# Patient Record
Sex: Female | Born: 1955 | Race: Black or African American | Hispanic: No | Marital: Single | Smoking: Never smoker
Health system: Southern US, Community
[De-identification: ages and names within clinical notes are randomized; demographics above are authoritative.]

## PROBLEM LIST (undated history)

## (undated) DIAGNOSIS — I341 Nonrheumatic mitral (valve) prolapse: Secondary | ICD-10-CM

## (undated) DIAGNOSIS — I499 Cardiac arrhythmia, unspecified: Secondary | ICD-10-CM

## (undated) DIAGNOSIS — I498 Other specified cardiac arrhythmias: Secondary | ICD-10-CM

## (undated) HISTORY — PX: OVARIAN CYST REMOVAL: SHX89

## (undated) HISTORY — PX: OTHER SURGICAL HISTORY: SHX169

---

## 2004-04-08 ENCOUNTER — Encounter: Admission: RE | Admit: 2004-04-08 | Discharge: 2004-04-08 | Payer: Self-pay | Admitting: Family Medicine

## 2006-02-21 ENCOUNTER — Encounter: Admission: RE | Admit: 2006-02-21 | Discharge: 2006-02-21 | Payer: Self-pay | Admitting: Internal Medicine

## 2010-02-13 ENCOUNTER — Encounter: Payer: Self-pay | Admitting: Family Medicine

## 2010-02-13 ENCOUNTER — Encounter: Payer: Self-pay | Admitting: Internal Medicine

## 2013-01-08 ENCOUNTER — Encounter (HOSPITAL_COMMUNITY): Payer: Self-pay | Admitting: Emergency Medicine

## 2013-01-08 ENCOUNTER — Emergency Department (HOSPITAL_COMMUNITY)
Admission: EM | Admit: 2013-01-08 | Discharge: 2013-01-08 | Disposition: A | Discharge status: Home / Self Care | Payer: BC Managed Care – PPO | Attending: Emergency Medicine | Admitting: Emergency Medicine

## 2013-01-08 ENCOUNTER — Emergency Department (HOSPITAL_COMMUNITY): Payer: BC Managed Care – PPO

## 2013-01-08 DIAGNOSIS — R0789 Other chest pain: Secondary | ICD-10-CM | POA: Insufficient documentation

## 2013-01-08 DIAGNOSIS — M79609 Pain in unspecified limb: Secondary | ICD-10-CM | POA: Insufficient documentation

## 2013-01-08 DIAGNOSIS — Z7982 Long term (current) use of aspirin: Secondary | ICD-10-CM | POA: Insufficient documentation

## 2013-01-08 DIAGNOSIS — M79602 Pain in left arm: Secondary | ICD-10-CM

## 2013-01-08 DIAGNOSIS — I498 Other specified cardiac arrhythmias: Secondary | ICD-10-CM | POA: Insufficient documentation

## 2013-01-08 DIAGNOSIS — Z79899 Other long term (current) drug therapy: Secondary | ICD-10-CM | POA: Insufficient documentation

## 2013-01-08 DIAGNOSIS — M25519 Pain in unspecified shoulder: Secondary | ICD-10-CM | POA: Insufficient documentation

## 2013-01-08 DIAGNOSIS — R0602 Shortness of breath: Secondary | ICD-10-CM | POA: Insufficient documentation

## 2013-01-08 DIAGNOSIS — R42 Dizziness and giddiness: Secondary | ICD-10-CM | POA: Insufficient documentation

## 2013-01-08 DIAGNOSIS — Z8679 Personal history of other diseases of the circulatory system: Secondary | ICD-10-CM | POA: Insufficient documentation

## 2013-01-08 DIAGNOSIS — R079 Chest pain, unspecified: Secondary | ICD-10-CM

## 2013-01-08 HISTORY — DX: Cardiac arrhythmia, unspecified: I49.9

## 2013-01-08 HISTORY — DX: Other specified cardiac arrhythmias: I49.8

## 2013-01-08 HISTORY — DX: Nonrheumatic mitral (valve) prolapse: I34.1

## 2013-01-08 LAB — BASIC METABOLIC PANEL
BUN: 17 mg/dL (ref 6–23)
CO2: 28 mEq/L (ref 19–32)
Calcium: 9.3 mg/dL (ref 8.4–10.5)
Chloride: 104 mEq/L (ref 96–112)
Creatinine, Ser: 0.78 mg/dL (ref 0.50–1.10)
GFR calc Af Amer: >90 mL/min (ref >90)
GFR calc non Af Amer: 90 mL/min — ABNORMAL LOW (ref >90)
Sodium: 141 mEq/L (ref 135–145)

## 2013-01-08 LAB — CBC
Hemoglobin: 13.4 g/dL (ref 12.0–15.0)
MCHC: 34.7 g/dL (ref 30.0–36.0)

## 2013-01-08 LAB — TROPONIN I: Troponin I: <0.3 ng/mL (ref <0.30)

## 2013-01-08 MED ORDER — ASPIRIN 81 MG PO CHEW: 324.0000 mg | CHEWABLE_TABLET | Freq: Once | ORAL | Status: DC

## 2013-01-08 NOTE — ED Notes (Signed)
Pt given d/c instructions and verbalized understanding. NAD at this time. VS are stable.  

## 2013-01-08 NOTE — ED Notes (Signed)
EDP Evelyn Serrano at the bedside updating the pt.

## 2013-01-08 NOTE — ED Provider Notes (Signed)
CSN: [id number]     Arrival date & time 01/08/13  1553 History   First MD Initiated Contact with Patient 01/08/13 1702     Chief Complaint  Patient presents with  . Shoulder Pain  . Arm Pain   (Consider location/radiation/quality/duration/timing/severity/associated sxs/prior Treatment) Patient is a 57 y.o. female presenting with chest pain. The history is provided by the patient.  Chest Pain Pain location:  L chest (left upper chest) Pain quality: sharp   Radiates to: left shoulder and arm to elbow. Pain severity:  Moderate Onset quality:  Gradual Duration:  3 hours Timing:  Constant Progression:  Partially resolved Chronicity:  New Context comment:  Last night, while exercising,she noticed that her left hand felt "funny."  Then, she felt unable to lay flat, and had to prop herself up to sleep.  (not due to shortness of breath, but because she felt uncomfortable flat_).  Then chest pain developed. Relieved by:  Nothing Worsened by:  Nothing tried Ineffective treatments:  None tried Associated symptoms: dizziness and shortness of breath   Associated symptoms: no abdominal pain, no cough, no diaphoresis, no fever, no lower extremity edema, no nausea and not vomiting     Past Medical History  Diagnosis Date  . Mitral valve prolapse   . Bigeminy    History reviewed. No pertinent past surgical history. History reviewed. No pertinent family history. History  Substance Use Topics  . Smoking status: Never Smoker   . Smokeless tobacco: Not on file  . Alcohol Use: No   OB History   Grav Para Term Preterm Abortions TAB SAB Ect Mult Living                 Review of Systems  Constitutional: Negative for fever and diaphoresis.  HENT: Negative for congestion.   Respiratory: Positive for shortness of breath. Negative for cough.   Cardiovascular: Positive for chest pain.  Gastrointestinal: Negative for nausea, vomiting, abdominal pain and diarrhea.  Neurological: Positive for  dizziness.  All other systems reviewed and are negative.    Allergies  Ace inhibitors  Home Medications   Current Outpatient Rx  Name  Route  Sig  Dispense  Refill  . Ascorbic Acid (VITAMIN C) 1000 MG tablet   Oral   Take 1,000 mg by mouth daily.         Marland Kitchen aspirin 81 MG tablet   Oral   Take 162 mg by mouth daily.         . B Complex-Biotin-FA (B COMPLETE) TABS   Oral   Take 1 tablet by mouth daily.         . Calcium Carb-Cholecalciferol (CALCIUM 600 + D) 600-200 MG-UNIT TABS   Oral   Take 1 tablet by mouth daily.         . cholecalciferol (VITAMIN D) 400 UNITS TABS tablet   Oral   Take 400 Units by mouth daily.         . Coenzyme Q10 (COQ-10) 50 MG CAPS   Oral   Take 1 capsule by mouth daily.         . Flaxseed, Linseed, (FLAXSEED OIL) 1000 MG CAPS   Oral   Take 1 capsule by mouth daily.         Marland Kitchen GARLIC PO   Oral   Take 1 capsule by mouth daily.          . Ginkgo Biloba 500 MG CAPS   Oral   Take 1 capsule by mouth  daily.         . Glucosamine HCl 1000 MG TABS   Oral   Take 1 tablet by mouth daily.         . hydrochlorothiazide (MICROZIDE) 12.5 MG capsule   Oral   Take 12.5 mg by mouth daily.         . metoprolol succinate (TOPROL-XL) 50 MG 24 hr tablet   Oral   Take 50 mg by mouth daily. Take with or immediately following a meal.         . Multiple Vitamin (MULTIVITAMIN WITH MINERALS) TABS tablet   Oral   Take 1 tablet by mouth daily.         . Omega-3 Fatty Acids (FISH OIL) 1000 MG CAPS   Oral   Take 2 capsules by mouth daily.         . SOY ISOFLAVONE PO   Oral   Take 1 capsule by mouth daily.         Marland Kitchen telmisartan (MICARDIS) 80 MG tablet   Oral   Take 80 mg by mouth daily.          BP 155/76  Pulse 63  Temp(Src) 98.1 F (36.7 C) (Oral)  Resp 18  Ht 5\' 8"  (1.727 m)  Wt 202 lb (91.627 kg)  BMI 30.72 kg/m2  SpO2 100% Physical Exam  Nursing note and vitals reviewed. Constitutional: She is oriented  to person, place, and time. She appears well-developed and well-nourished. No distress.  HENT:  Head: Normocephalic and atraumatic.  Mouth/Throat: Oropharynx is clear and moist.  Eyes: Conjunctivae are normal. Pupils are equal, round, and reactive to light. No scleral icterus.  Neck: Neck supple.  Cardiovascular: Normal rate, regular rhythm, normal heart sounds and intact distal pulses.   No murmur heard. Pulmonary/Chest: Effort normal and breath sounds normal. No stridor. No respiratory distress. She has no rales.  Abdominal: Soft. Bowel sounds are normal. She exhibits no distension. There is no tenderness.  Musculoskeletal: Normal range of motion.       Left shoulder: She exhibits tenderness and bony tenderness. She exhibits normal range of motion (pain with ROM).  Neurological: She is alert and oriented to person, place, and time.  Skin: Skin is warm and dry. No rash noted.  Psychiatric: She has a normal mood and affect. Her behavior is normal.    ED Course  Procedures (including critical care time) Labs Review Labs Reviewed  CBC  BASIC METABOLIC PANEL   Imaging Review Dg Chest 2 View  01/08/2013   CLINICAL DATA:  Chest pain  EXAM: CHEST  2 VIEW  COMPARISON:  None.  FINDINGS: The heart size and mediastinal contours are within normal limits. No acute infiltrate or pulmonary edema. Mild degenerative changes thoracic spine.  IMPRESSION: No active cardiopulmonary disease.   Electronically Signed   By: Natasha Mead M.D.   On: 01/08/2013 17:07  All radiology studies independently viewed by me.     EKG Interpretation    Date/Time:  Wednesday January 08 2013 15:56:48 EST Ventricular Rate:  86 PR Interval:  154 QRS Duration: 82 QT Interval:  364 QTC Calculation: 435 R Axis:   42 Text Interpretation:  Normal sinus rhythm Normal ECG No old tracing to compare Confirmed by [facility]  MD, TREY ([id number]) on 01/08/2013 5:25:34 PM            MDM   1. Chest pain   2. Left arm pain     57 yo female with  atypical chest pain.  She has a history of left frozen shoulder, and is unsure if her pain is related to her shoulder.  Pain in her chest has near resolved and she now has pain primarily in her left upper arm.  She thinks she may have had some shortness of breath, but no nausea.  She has felt lightheaded.  Hx of HTN and DM, but not HLD, no family hx of CAD.  Her description of her inability to lay flat is unclear, but she reports not feeling SOB.  She is able to lay flat in the ED.   Plan lab workup, but feel that she is low risk for ACS.  Also doubt PE and Dissection.    8:54 PM Initial workup reassuring.  Plan delta troponin and then DC home with  PCP if negative.  Sharen Hones will follow up on delta troponin.    Candyce Churn, MD 01/08/13 [phone]

## 2013-01-08 NOTE — ED Notes (Signed)
Pt c/o left shoulder and arm pain starting last night after exercising; pt sts some SOB and still having pain in shoulder and left arm

## 2013-01-08 NOTE — ED Provider Notes (Signed)
Second cardiac marker term negative patient has remained pain-free.  She will follow up with her primary care physician by phone in the morning  Arman Filter, NP 01/08/13 2213

## 2013-01-09 NOTE — ED Provider Notes (Signed)
Medical screening examination/treatment/procedure(s) were conducted as a shared visit with non-physician practitioner(s) and myself.  I personally evaluated the patient during the encounter.   Please see my separate note.     Candyce Churn, MD 01/09/13 2153

## 2014-03-31 ENCOUNTER — Emergency Department (HOSPITAL_COMMUNITY)
Admission: EM | Admit: 2014-03-31 | Discharge: 2014-03-31 | Disposition: A | Discharge status: Home / Self Care | Payer: BLUE CROSS/BLUE SHIELD | Attending: Emergency Medicine | Admitting: Emergency Medicine

## 2014-03-31 ENCOUNTER — Encounter (HOSPITAL_COMMUNITY): Payer: Self-pay

## 2014-03-31 DIAGNOSIS — R002 Palpitations: Secondary | ICD-10-CM

## 2014-03-31 DIAGNOSIS — Z7982 Long term (current) use of aspirin: Secondary | ICD-10-CM | POA: Insufficient documentation

## 2014-03-31 DIAGNOSIS — M79601 Pain in right arm: Secondary | ICD-10-CM | POA: Diagnosis not present

## 2014-03-31 DIAGNOSIS — Z79899 Other long term (current) drug therapy: Secondary | ICD-10-CM | POA: Insufficient documentation

## 2014-03-31 DIAGNOSIS — R42 Dizziness and giddiness: Secondary | ICD-10-CM | POA: Diagnosis not present

## 2014-03-31 DIAGNOSIS — Z8679 Personal history of other diseases of the circulatory system: Secondary | ICD-10-CM | POA: Diagnosis not present

## 2014-03-31 LAB — CBC
HCT: 38.9 % (ref 36.0–46.0)
Hemoglobin: 13.7 g/dL (ref 12.0–15.0)
MCH: 29.4 pg (ref 26.0–34.0)
MCHC: 35.2 g/dL (ref 30.0–36.0)
MCV: 83.5 fL (ref 78.0–100.0)
Platelets: 212 10*3/uL (ref 150–400)
RBC: 4.66 MIL/uL (ref 3.87–5.11)
RDW: 15.1 % (ref 11.5–15.5)
WBC: 7.5 10*3/uL (ref 4.0–10.5)

## 2014-03-31 LAB — COMPREHENSIVE METABOLIC PANEL
ALT: 24 U/L (ref 0–35)
AST: 18 U/L (ref 0–37)
Albumin: 4.1 g/dL (ref 3.5–5.2)
Alkaline Phosphatase: 56 U/L (ref 39–117)
Anion gap: 15 (ref 5–15)
BUN: 17 mg/dL (ref 6–23)
CALCIUM: 9.6 mg/dL (ref 8.4–10.5)
CO2: 21 mmol/L (ref 19–32)
CREATININE: 1.09 mg/dL (ref 0.50–1.10)
Chloride: 105 mmol/L (ref 96–112)
GFR calc non Af Amer: 54 mL/min — ABNORMAL LOW (ref >90)
GFR, EST AFRICAN AMERICAN: 63 mL/min — AB (ref >90)
Glucose, Bld: 147 mg/dL — ABNORMAL HIGH (ref 70–99)
Potassium: 3.7 mmol/L (ref 3.5–5.1)
Sodium: 141 mmol/L (ref 135–145)
Total Bilirubin: 0.3 mg/dL (ref 0.3–1.2)
Total Protein: 7.2 g/dL (ref 6.0–8.3)

## 2014-03-31 LAB — TROPONIN I

## 2014-03-31 LAB — I-STAT CHEM 8, ED
BUN: 19 mg/dL (ref 6–23)
CALCIUM ION: 1.19 mmol/L (ref 1.13–1.30)
CREATININE: 1 mg/dL (ref 0.50–1.10)
Chloride: 101 mmol/L (ref 96–112)
Glucose, Bld: 148 mg/dL — ABNORMAL HIGH (ref 70–99)
HEMATOCRIT: 43 % (ref 36.0–46.0)
HEMOGLOBIN: 14.6 g/dL (ref 12.0–15.0)
Potassium: 3.6 mmol/L (ref 3.5–5.1)
Sodium: 141 mmol/L (ref 135–145)
TCO2: 25 mmol/L (ref 0–100)

## 2014-03-31 LAB — I-STAT TROPONIN, ED: Troponin i, poc: 0 ng/mL (ref 0.00–0.08)

## 2014-03-31 LAB — DIFFERENTIAL
Basophils Absolute: 0 10*3/uL (ref 0.0–0.1)
Basophils Relative: 0 % (ref 0–1)
EOS ABS: 0.1 10*3/uL (ref 0.0–0.7)
Eosinophils Relative: 2 % (ref 0–5)
Lymphocytes Relative: 30 % (ref 12–46)
Lymphs Abs: 2.3 10*3/uL (ref 0.7–4.0)
Monocytes Absolute: 0.4 10*3/uL (ref 0.1–1.0)
Monocytes Relative: 6 % (ref 3–12)
NEUTROS PCT: 62 % (ref 43–77)
Neutro Abs: 4.6 10*3/uL (ref 1.7–7.7)

## 2014-03-31 LAB — CBG MONITORING, ED
Glucose-Capillary: 141 mg/dL — ABNORMAL HIGH (ref 70–99)
Glucose-Capillary: 150 mg/dL — ABNORMAL HIGH (ref 70–99)

## 2014-03-31 LAB — APTT: aPTT: 28 seconds (ref 24–37)

## 2014-03-31 LAB — PROTIME-INR
INR: 1.01 (ref 0.00–1.49)
PROTHROMBIN TIME: 13.4 s (ref 11.6–15.2)

## 2014-03-31 NOTE — ED Notes (Signed)
Pt states she was sitting at her desk and started having right arm pain, then started having lightheadedness and started having palpitations took 2- 325 mg ASA. Called her cardiologist and she recommended coming in to be seen. Denies any weakness or numbness or tingling. No facial droop. No speech deficits.

## 2014-03-31 NOTE — Discharge Instructions (Signed)
Palpitations A palpitation is the feeling that your heartbeat is irregular or is faster than normal. It may feel like your heart is fluttering or skipping a beat. Palpitations are usually not a serious problem. However, in some cases, you may need further medical evaluation. CAUSES  Palpitations can be caused by:  Smoking.  Caffeine or other stimulants, such as diet pills or energy drinks.  Alcohol.  Stress and anxiety.  Strenuous physical activity.  Fatigue.  Certain medicines.  Heart disease, especially if you have a history of irregular heart rhythms (arrhythmias), such as atrial fibrillation, atrial flutter, or supraventricular tachycardia.  An improperly working pacemaker or defibrillator. DIAGNOSIS  To find the cause of your palpitations, your health care provider will take your medical history and perform a physical exam. Your health care provider may also have you take a test called an ambulatory electrocardiogram (ECG). An ECG records your heartbeat patterns over a 24-hour period. You may also have other tests, such as:  Transthoracic echocardiogram (TTE). During echocardiography, sound waves are used to evaluate how blood flows through your heart.  Transesophageal echocardiogram (TEE).  Cardiac monitoring. This allows your health care provider to monitor your heart rate and rhythm in real time.  Holter monitor. This is a portable device that records your heartbeat and can help diagnose heart arrhythmias. It allows your health care provider to track your heart activity for several days, if needed.  Stress tests by exercise or by giving medicine that makes the heart beat faster. TREATMENT  Treatment of palpitations depends on the cause of your symptoms and can vary greatly. Most cases of palpitations do not require any treatment other than time, relaxation, and monitoring your symptoms. Other causes, such as atrial fibrillation, atrial flutter, or supraventricular  tachycardia, usually require further treatment. HOME CARE INSTRUCTIONS   Avoid:  Caffeinated coffee, tea, soft drinks, diet pills, and energy drinks.  Chocolate.  Alcohol.  Stop smoking if you smoke.  Reduce your stress and anxiety. Things that can help you relax include:  A method of controlling things in your body, such as your heartbeats, with your mind (biofeedback).  Yoga.  Meditation.  Physical activity such as swimming, jogging, or walking.  Get plenty of rest and sleep. SEEK MEDICAL CARE IF:   You continue to have a fast or irregular heartbeat beyond 24 hours.  Your palpitations occur more often. SEEK IMMEDIATE MEDICAL CARE IF:  You have chest pain or shortness of breath.  You have a severe headache.  You feel dizzy or you faint. MAKE SURE YOU:  Understand these instructions.  Will watch your condition.  Will get help right away if you are not doing well or get worse. Document Released: 01/07/2000 Document Revised: 01/14/2013 Document Reviewed: 03/10/2011 [facility] Patient Information 2015 [place], [place]. This information is not intended to replace advice given to you by your health care provider. Make sure you discuss any questions you have with your health care provider. Precautions for Chest Pain (Nonspecific) It is often hard to give a specific diagnosis for the cause of chest pain. There is always a chance that your pain could be related to something serious, such as a heart attack or a blood clot in the lungs. You need to follow up with your health care provider for further evaluation. CAUSES   Heartburn.  Pneumonia or bronchitis.  Anxiety or stress.  Inflammation around your heart (pericarditis) or lung (pleuritis or pleurisy).  A blood clot in the lung.  A collapsed lung (pneumothorax).  It can develop suddenly on its own (spontaneous pneumothorax) or from trauma to the chest.  Shingles infection (herpes zoster virus). The chest wall is  composed of bones, muscles, and cartilage. Any of these can be the source of the pain.  The bones can be bruised by injury.  The muscles or cartilage can be strained by coughing or overwork.  The cartilage can be affected by inflammation and become sore (costochondritis). DIAGNOSIS  Lab tests or other studies may be needed to find the cause of your pain. Your health care provider may have you take a test called an ambulatory electrocardiogram (ECG). An ECG records your heartbeat patterns over a 24-hour period. You may also have other tests, such as:  Transthoracic echocardiogram (TTE). During echocardiography, sound waves are used to evaluate how blood flows through your heart.  Transesophageal echocardiogram (TEE).  Cardiac monitoring. This allows your health care provider to monitor your heart rate and rhythm in real time.  Holter monitor. This is a portable device that records your heartbeat and can help diagnose heart arrhythmias. It allows your health care provider to track your heart activity for several days, if needed.  Stress tests by exercise or by giving medicine that makes the heart beat faster. TREATMENT   Treatment depends on what may be causing your chest pain. Treatment may include:  Acid blockers for heartburn.  Anti-inflammatory medicine.  Pain medicine for inflammatory conditions.  Antibiotics if an infection is present.  You may be advised to change lifestyle habits. This includes stopping smoking and avoiding alcohol, caffeine, and chocolate.  You may be advised to keep your head raised (elevated) when sleeping. This reduces the chance of acid going backward from your stomach into your esophagus. Most of the time, nonspecific chest pain will improve within 2-3 days with rest and mild pain medicine.  HOME CARE INSTRUCTIONS   If antibiotics were prescribed, take them as directed. Finish them even if you start to feel better.  For the next few days, avoid  physical activities that bring on chest pain. Continue physical activities as directed.  Do not use any tobacco products, including cigarettes, chewing tobacco, or electronic cigarettes.  Avoid drinking alcohol.  Only take medicine as directed by your health care provider.  Follow your health care provider's suggestions for further testing if your chest pain does not go away.  Keep any follow-up appointments you made. If you do not go to an appointment, you could develop lasting (chronic) problems with pain. If there is any problem keeping an appointment, call to reschedule. SEEK MEDICAL CARE IF:   Your chest pain does not go away, even after treatment.  You have a rash with blisters on your chest.  You have a fever. SEEK IMMEDIATE MEDICAL CARE IF:   You have increased chest pain or pain that spreads to your arm, neck, jaw, back, or abdomen.  You have shortness of breath.  You have an increasing cough, or you cough up blood.  You have severe back or abdominal pain.  You feel nauseous or vomit.  You have severe weakness.  You faint.  You have chills. This is an emergency. Do not wait to see if the pain will go away. Get medical help at once. Call your local emergency services (911 in [place] Do not drive yourself to the hospital. MAKE SURE YOU:   Understand these instructions.  Will watch your condition.  Will get help right away if you are not doing well or get worse.  Document Released: 10/19/2004 Document Revised: 01/14/2013 Document Reviewed: 08/15/2007 [facility] Patient Information 2015 [place], [place]. This information is not intended to replace advice given to you by your health care provider. Make sure you discuss any questions you have with your health care provider.

## 2014-03-31 NOTE — ED Provider Notes (Signed)
CSN: [id number]     Arrival date & time 03/31/14  1549 History   First MD Initiated Contact with Patient 03/31/14 1619     Chief Complaint  Patient presents with  . Palpitations  . Dizziness     (Consider location/radiation/quality/duration/timing/severity/associated sxs/prior Treatment) HPI The patient ports that she was sitting at work and she developed some pain in her right arm. She indicates her biceps region. She reports as the pain persisted she began to feel like her heart was racing and she felt slightly lightheaded. She took 2 aspirin and called her cardiologist who recommended she be evaluated in the emergency department. She reports she had no weakness or numbness in the arm. It was not tingling. She reports all of her symptoms are improved now but she still has a slight sore sensation that she indicates is on the medial biceps region of the right arm and in the forearm over the radial surface. The patient has had prior episodes of palpitations and occasional chest pain. She has worked with cardiology and had Holter monitoring with no history of dysrhythmia. She's had echocardiogram done and has known mitral valve prolapse. She reports prior to this episode she went to work feeling well. Past Medical History  Diagnosis Date  . Mitral valve prolapse   . Bigeminy    Past Surgical History  Procedure Laterality Date  . Other surgical history      fibroid cyst removed   No family history on file. History  Substance Use Topics  . Smoking status: Never Smoker   . Smokeless tobacco: Not on file  . Alcohol Use: No   OB History    No data available     Review of Systems  10 Systems reviewed and are negative for acute change except as noted in the HPI.   Allergies  Ace inhibitors  Home Medications   Prior to Admission medications   Medication Sig Start Date End Date Taking? Authorizing Provider  Ascorbic Acid (VITAMIN C) 1000 MG tablet Take 1,000 mg by mouth daily.   Yes  Historical Provider, MD  aspirin 81 MG tablet Take 162 mg by mouth daily.   Yes Historical Provider, MD  B Complex-Biotin-FA (B COMPLETE) TABS Take 1 tablet by mouth daily.   Yes Historical Provider, MD  Calcium Carb-Cholecalciferol (CALCIUM 600 + D) 600-200 MG-UNIT TABS Take 1 tablet by mouth daily.   Yes Historical Provider, MD  cholecalciferol (VITAMIN D) 400 UNITS TABS tablet Take 400 Units by mouth daily.   Yes Historical Provider, MD  Coenzyme Q10 (COQ10) 100 MG CAPS Take 1 capsule by mouth daily.   Yes Historical Provider, MD  Flaxseed, Linseed, (FLAXSEED OIL) 1000 MG CAPS Take 1 capsule by mouth daily.   Yes Historical Provider, MD  GARLIC PO Take 1 capsule by mouth daily.    Yes Historical Provider, MD  Ginkgo Biloba 500 MG CAPS Take 1 capsule by mouth daily.   Yes Historical Provider, MD  Glucosamine HCl 1000 MG TABS Take 1 tablet by mouth daily.   Yes Historical Provider, MD  hydrochlorothiazide (MICROZIDE) 12.5 MG capsule Take 12.5 mg by mouth daily.   Yes Historical Provider, MD  metoprolol succinate (TOPROL-XL) 50 MG 24 hr tablet Take 50 mg by mouth daily. Take with or immediately following a meal.   Yes Historical Provider, MD  Multiple Vitamin (MULTIVITAMIN WITH MINERALS) TABS tablet Take 1 tablet by mouth daily.   Yes Historical Provider, MD  Omega-3 Fatty Acids (FISH OIL) 1000  MG CAPS Take 2 capsules by mouth daily.   Yes Historical Provider, MD  ranitidine (ZANTAC) 150 MG capsule Take 150 mg by mouth daily.   Yes Historical Provider, MD  telmisartan (MICARDIS) 40 MG tablet Take 40 mg by mouth daily.   Yes Historical Provider, MD   BP 126/68 mmHg  Pulse 79  Temp(Src) 98.7 F (37.1 C) (Oral)  Resp 19  Ht 5\' 8"  (1.727 m)  Wt 190 lb (86.183 kg)  BMI 28.90 kg/m2  SpO2 97% Physical Exam  Constitutional: She is oriented to person, place, and time. She appears well-developed and well-nourished.  HENT:  Head: Normocephalic and atraumatic.  Eyes: EOM are normal. Pupils are  equal, round, and reactive to light.  Neck: Neck supple.  Cardiovascular: Normal rate, regular rhythm, normal heart sounds and intact distal pulses.   Pulmonary/Chest: Effort normal and breath sounds normal.  Abdominal: Soft. Bowel sounds are normal. She exhibits no distension. There is no tenderness.  Musculoskeletal: Normal range of motion. She exhibits no edema.  Neurological: She is alert and oriented to person, place, and time. She has normal strength. Coordination normal. GCS eye subscore is 4. GCS verbal subscore is 5. GCS motor subscore is 6.  Skin: Skin is warm, dry and intact.  Psychiatric: She has a normal mood and affect.    ED Course  Procedures (including critical care time) Labs Review Labs Reviewed  COMPREHENSIVE METABOLIC PANEL - Abnormal; Notable for the following:    Glucose, Bld 147 (*)    GFR calc non Af Amer 54 (*)    GFR calc Af Amer 63 (*)    All other components within normal limits  CBG MONITORING, ED - Abnormal; Notable for the following:    Glucose-Capillary 141 (*)    All other components within normal limits  I-STAT CHEM 8, ED - Abnormal; Notable for the following:    Glucose, Bld 148 (*)    All other components within normal limits  CBG MONITORING, ED - Abnormal; Notable for the following:    Glucose-Capillary 150 (*)    All other components within normal limits  PROTIME-INR  APTT  CBC  DIFFERENTIAL  TROPONIN I  I-STAT TROPOININ, ED    Imaging Review No results found.   EKG Interpretation   Date/Time:  Tuesday March 31 2014 15:53:57 EST Ventricular Rate:  79 PR Interval:  150 QRS Duration: 82 QT Interval:  392 QTC Calculation: 449 R Axis:   36 Text Interpretation:  Normal sinus rhythm Cannot rule out Anterior infarct  , age undetermined Abnormal ECG no change from old Confirmed by Donnald GarrePfeiffer,  MD, Lebron ConnersMarcy [phone]([id number]) on 03/31/2014 9:15:50 PM      MDM   Final diagnoses:  Palpitations  Right arm pain   This patient's presentation does  not sound acutely ischemic in nature. She has 2 sets of negative cardiac enzymes. Patient has had prior evaluation for palpitations and dysrhythmia with Holter monitoring. There is not any evidence of dysrhythmia during her evaluation. Vital signs have remained stable. At this point he feel she is safe for continued outpatient evaluation and instructed follow-up with her cardiologist. Patient is given instructions to return should she have new changing or worsening symptoms.    Arby BarretteMarcy Koray Soter, MD 03/31/14 2219

## 2016-06-06 ENCOUNTER — Encounter (HOSPITAL_COMMUNITY): Payer: Self-pay | Admitting: Family Medicine

## 2016-06-06 ENCOUNTER — Emergency Department (HOSPITAL_COMMUNITY): Payer: Worker's Compensation

## 2016-06-06 ENCOUNTER — Emergency Department (HOSPITAL_COMMUNITY)
Admission: EM | Admit: 2016-06-06 | Discharge: 2016-06-06 | Disposition: A | Discharge status: Home / Self Care | Payer: Worker's Compensation | Attending: Emergency Medicine | Admitting: Emergency Medicine

## 2016-06-06 DIAGNOSIS — S83512A Sprain of anterior cruciate ligament of left knee, initial encounter: Secondary | ICD-10-CM | POA: Diagnosis not present

## 2016-06-06 DIAGNOSIS — S8392XA Sprain of unspecified site of left knee, initial encounter: Secondary | ICD-10-CM

## 2016-06-06 DIAGNOSIS — Z7982 Long term (current) use of aspirin: Secondary | ICD-10-CM | POA: Insufficient documentation

## 2016-06-06 DIAGNOSIS — W1839XA Other fall on same level, initial encounter: Secondary | ICD-10-CM | POA: Diagnosis not present

## 2016-06-06 DIAGNOSIS — S8991XA Unspecified injury of right lower leg, initial encounter: Secondary | ICD-10-CM | POA: Diagnosis present

## 2016-06-06 DIAGNOSIS — Y929 Unspecified place or not applicable: Secondary | ICD-10-CM | POA: Diagnosis not present

## 2016-06-06 DIAGNOSIS — Y990 Civilian activity done for income or pay: Secondary | ICD-10-CM | POA: Insufficient documentation

## 2016-06-06 DIAGNOSIS — Y939 Activity, unspecified: Secondary | ICD-10-CM | POA: Diagnosis not present

## 2016-06-06 DIAGNOSIS — Z79899 Other long term (current) drug therapy: Secondary | ICD-10-CM | POA: Insufficient documentation

## 2016-06-06 NOTE — ED Notes (Signed)
Notified ortho of patients order.

## 2016-06-06 NOTE — Discharge Instructions (Signed)
Please read attached information. If you experience any new or worsening signs or symptoms please return to the emergency room for evaluation. Please follow-up with your primary care provider or specialist as discussed. Please use tylenol or ibuprofen as needed for discomfort.

## 2016-06-06 NOTE — ED Triage Notes (Signed)
Patient reports she lost her balance yesterday at work and fell. She is complaining of her right knee hurting as a dull pain. Denies numbness or tingling. Reports swelling.

## 2016-06-06 NOTE — ED Provider Notes (Signed)
[facility]-EMERGENCY DEPT Provider Note   CSN: [id number] Arrival date & time: 06/06/16  1802  By signing my name below, I, Evelyn Serrano, attest that this documentation has been prepared under the direction and in the presence of Newell RubbermaidJeffrey Jamonta Goerner, PA-C.  Electronically Signed: Rosario AdieWilliam Andrew Serrano, ED Scribe. 06/06/16. 6:55 PM.  History   Chief Complaint Chief Complaint  Patient presents with  . Knee Pain   The history is provided by the patient. No language interpreter was used.    HPI Comments:   Evelyn Serrano is a 61 y.o. female w/ a h/o knee arthritis, who presents to the Emergency Department complaining of sudden onset, improving dull right knee pain beginning yesterday. Per pt, she was reaching under her desk at work yesterday when she lost her balance and fell onto the knee, sustaining her pain. No other injury. Initially her pain was only minimal, however, after attempting to ambulate she noticed increased pain and stiffness to the joint. Her pain is now exacerbated with ambulation. She took 800mg  Advil yesterday moderate improvement; however, she reports that her work made her come to be evaluated for [employer]man's compensation. Today she has otherwise been ambulatory without significant difficulty. Pt is currently [facility] her h/o arthritis. She denies numbness, weakness, or any other associated symptoms.     Past Medical History:  Diagnosis Date  . Bigeminy   . Mitral valve prolapse    Patient Active Problem List   Diagnosis Date Noted  . Bigeminy    Past Surgical History:  Procedure Laterality Date  . OTHER SURGICAL HISTORY     fibroid cyst removed  . OVARIAN CYST REMOVAL     OB History    No data available     Home Medications    Prior to Admission medications   Medication Sig Start Date End Date Taking? Authorizing Provider  Ascorbic Acid (VITAMIN C) 1000 MG tablet Take 1,000 mg by mouth daily.    [provider]    aspirin 81 MG tablet Take 162 mg by mouth daily.    [provider]  B Complex-Biotin-FA (B COMPLETE) TABS Take 1 tablet by mouth daily.    [provider]  Calcium Carb-Cholecalciferol (CALCIUM 600 + D) 600-200 MG-UNIT TABS Take 1 tablet by mouth daily.    [provider]  cholecalciferol (VITAMIN D) 400 UNITS TABS tablet Take 400 Units by mouth daily.    [provider]  Coenzyme Q10 (COQ10) 100 MG CAPS Take 1 capsule by mouth daily.    [provider]  Flaxseed, Linseed, (FLAXSEED OIL) 1000 MG CAPS Take 1 capsule by mouth daily.    [provider]  GARLIC PO Take 1 capsule by mouth daily.     [provider]  Ginkgo Biloba 500 MG CAPS Take 1 capsule by mouth daily.    [provider]  Glucosamine HCl 1000 MG TABS Take 1 tablet by mouth daily.    [provider]  hydrochlorothiazide (MICROZIDE) 12.5 MG capsule Take 12.5 mg by mouth daily.    [provider]  metoprolol succinate (TOPROL-XL) 50 MG 24 hr tablet Take 50 mg by mouth daily. Take with or immediately following a meal.    [provider]  Multiple Vitamin (MULTIVITAMIN WITH MINERALS) TABS tablet Take 1 tablet by mouth daily.    [provider]  Omega-3 Fatty Acids (FISH OIL) 1000 MG CAPS Take 2 capsules by mouth daily.    [provider]  ranitidine (ZANTAC) 150 MG capsule Take 150 mg by mouth daily.    [provider]  telmisartan (MICARDIS) 40 MG tablet Take 40 mg by mouth daily.    [provider]   Family History History reviewed. No pertinent family history.  Social History Social History  Substance Use Topics  . Smoking status: Never Smoker  . Smokeless tobacco: Never Used  . Alcohol use Yes     Comment: Once every 6 months   Allergies   Ace inhibitors  Review of Systems Review of Systems  Musculoskeletal: Positive for arthralgias and myalgias.  Neurological: Negative for weakness  and numbness.  All other systems reviewed and are negative.  Physical Exam Updated Vital Signs BP 128/77 (BP Location: Left Arm)   Pulse 70   Temp 99.3 F (37.4 C) (Oral)   Resp 20   Ht 5\' 8"  (1.727 m)   Wt 98.9 kg   SpO2 97%   BMI 33.17 kg/m   Physical Exam  Constitutional: She appears well-developed and well-nourished. No distress.  HENT:  Head: Normocephalic and atraumatic.  Eyes: Conjunctivae are normal.  Neck: Normal range of motion.  Cardiovascular: Normal rate.   Pulmonary/Chest: Effort normal.  Abdominal: She exhibits no distension.  Musculoskeletal: Normal range of motion.  No obvious swelling or deformity of the knee. Nontender to palpation. Minor pain with deep flexion of the right knee., Minor tenderness to palpation of the MCL. No significant anterior or posterior tibial translation, no valgus varus laxity  Neurological: She is alert.  Skin: No pallor.  Psychiatric: She has a normal mood and affect. Her behavior is normal.  Nursing note and vitals reviewed.  ED Treatments / Results  DIAGNOSTIC STUDIES: Oxygen Saturation is 98% on RA, normal by my interpretation.   COORDINATION OF CARE: 6:55 PM-Discussed next steps with pt. Pt verbalized understanding and is agreeable with the plan.   Labs (all labs ordered are listed, but only abnormal results are displayed) Labs Reviewed - No data to display  EKG  EKG Interpretation None      Radiology Dg Knee Complete 4 Views Right  Result Date: 06/06/2016 CLINICAL DATA:  Patient reports she lost her balance yesterday at work and fell. She is complaining of anterior right knee pain that radiates posteriorly and distally towards heel EXAM: RIGHT KNEE - COMPLETE 4+ VIEW COMPARISON:  None. FINDINGS: Mild tricompartmental degenerative change, with associated mild joint space narrowing and mild osseous spurring. No evidence of advanced degenerative change. No fracture line or displaced fracture fragment seen. No acute  or suspicious osseous lesion. Probable joint effusion within the suprapatellar bursa. Superficial soft tissues are unremarkable. IMPRESSION: 1. No acute osseous abnormality.  No fracture or dislocation. 2. Probable joint effusion. 3. Mild tricompartmental degenerative change. Electronically Signed   By: Bary Richard M.D.   On: 06/06/2016 18:58    Procedures Procedures   Medications Ordered in ED Medications - No data to display  Initial Impression / Assessment and Plan / ED Course  I have reviewed the triage vital signs and the nursing notes.  Pertinent labs & imaging results that were available during my care of the patient were reviewed by me and considered in my medical decision making (see chart for details).      Imaging: DG R knee   Assessment/Plan: 61 year old female presents today with right knee pain. She has no obvious signs of trauma on exam. She does have what appears to be a small joint effusion on  her plain films. Patient likely with knee sprain, uncertain etiology although MCL high on differential. Patient seems very stable. She will be placed in knee immobilizer encouraged follow-up with orthopedics. Patient verbalized understanding and agreement to today's plan had no further questions or concerns at time of discharge.     Final Clinical Impressions(s) / ED Diagnoses   Final diagnoses:  Sprain of left knee, unspecified ligament, initial encounter   New Prescriptions Discharge Medication List as of 06/06/2016  7:02 PM     I personally performed the services described in this documentation, which was scribed in my presence. The recorded information has been reviewed and is accurate.    Eyvonne Mechanic, PA-C 06/06/16 2001    Mesner, Barbara Cower, MD 06/06/16 [phone]

## 2018-03-28 IMAGING — CR DG KNEE COMPLETE 4+V*R*
4 series · 4 of 4 positions shown · non-contrast
Comparison: None.

CLINICAL DATA: Patient reports she lost her balance yesterday at
work and fell. She is complaining of anterior right knee pain that
radiates posteriorly and distally towards heel

EXAM:
RIGHT KNEE - COMPLETE 4+ VIEW

[t knee ap right]
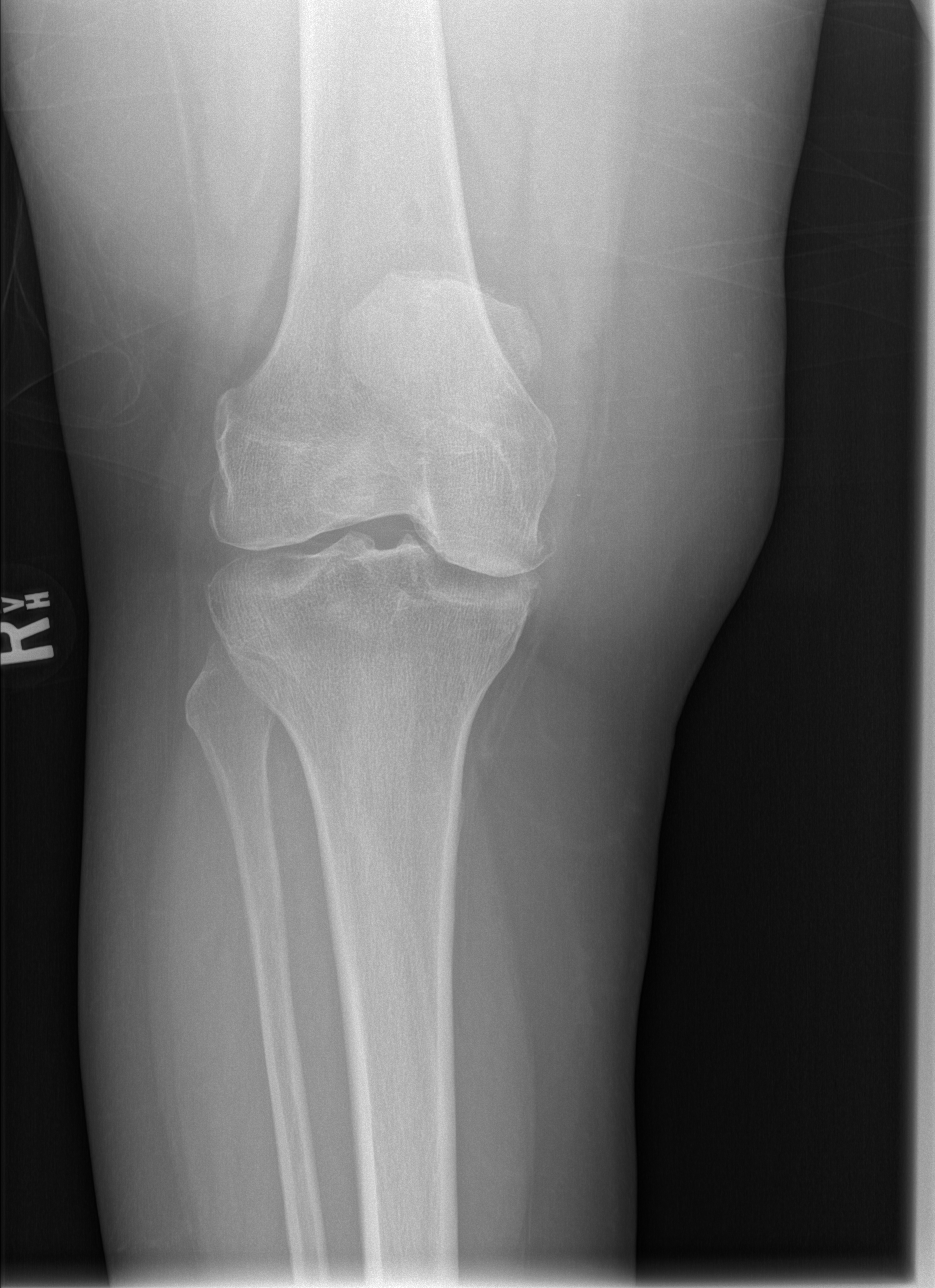

[t knee obl right (1 of 2)]
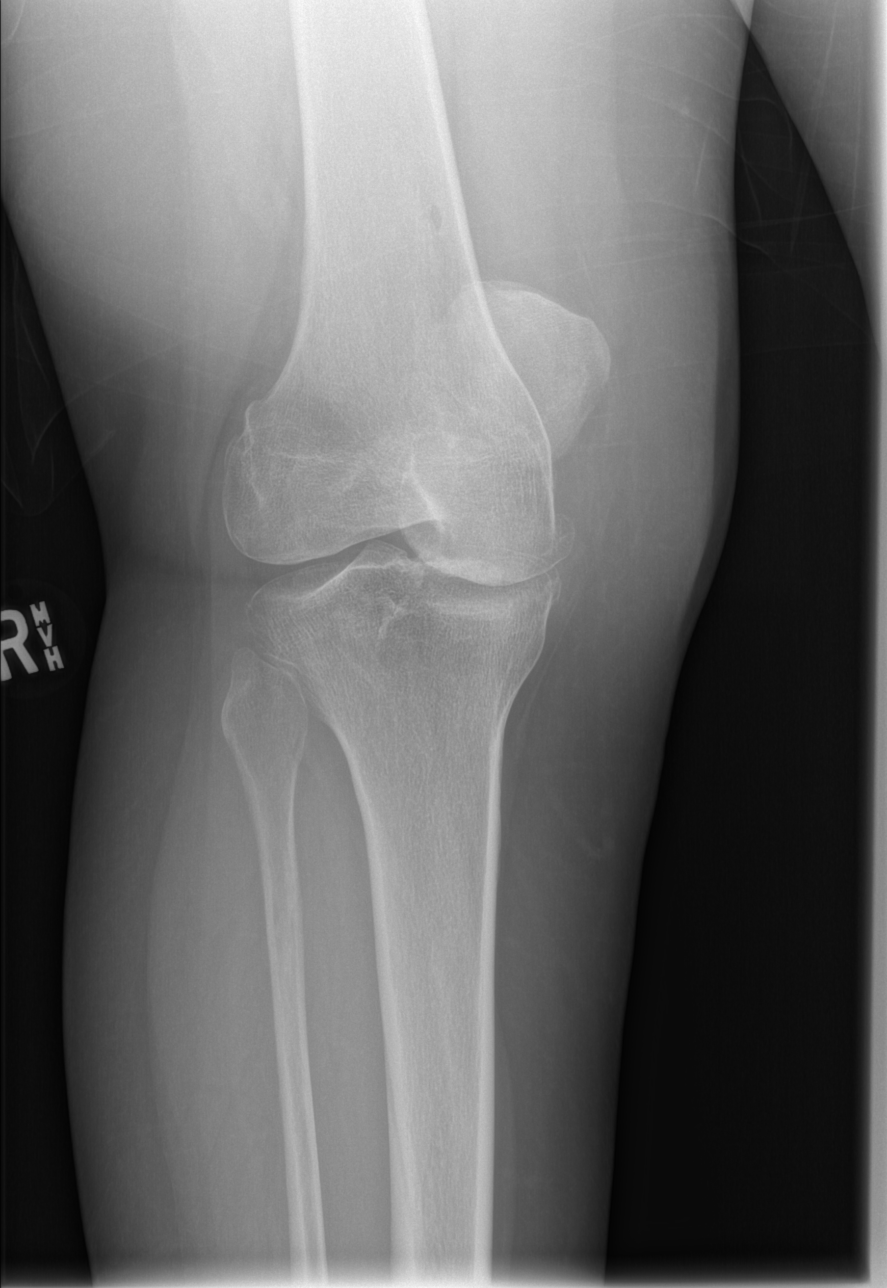

[t knee obl right (2 of 2)]
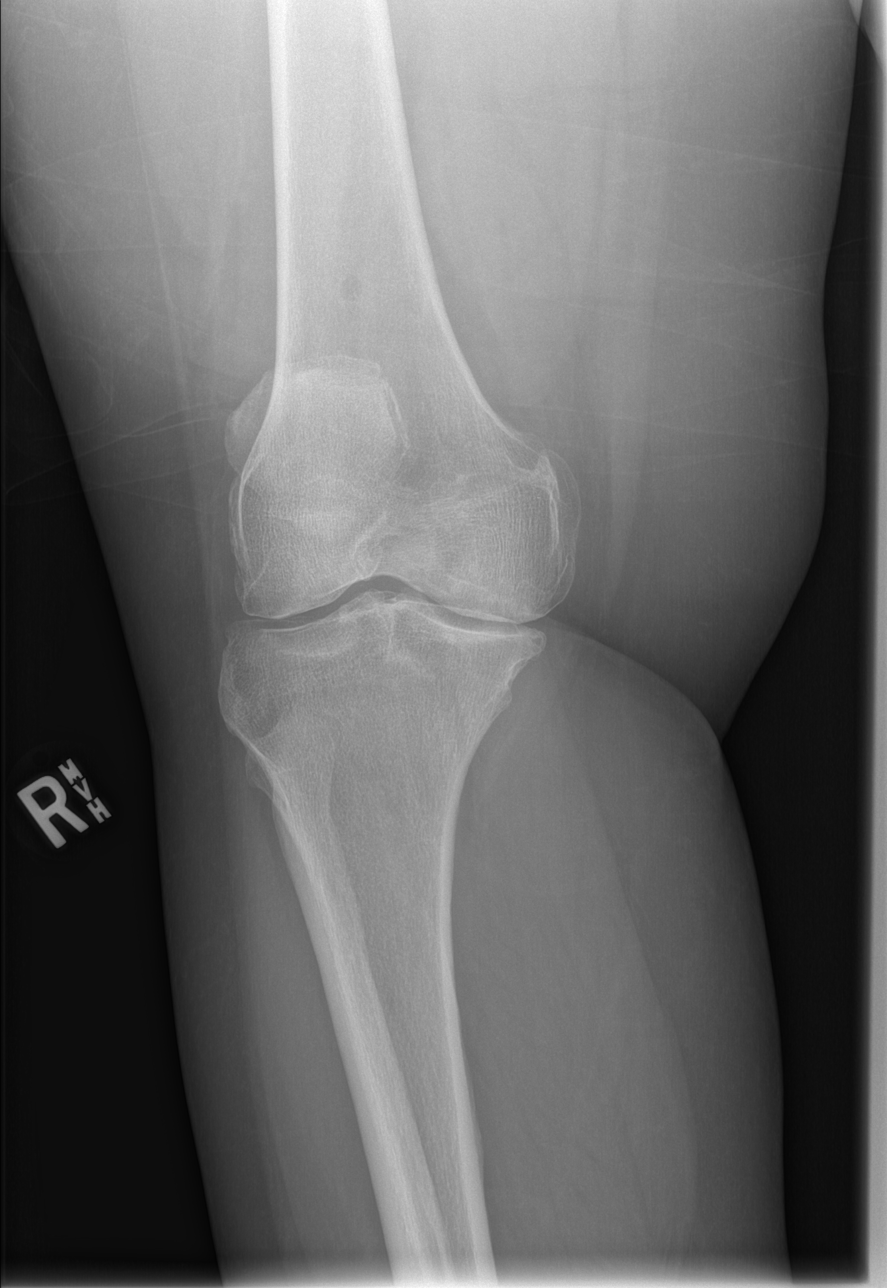

[t knee lat right]
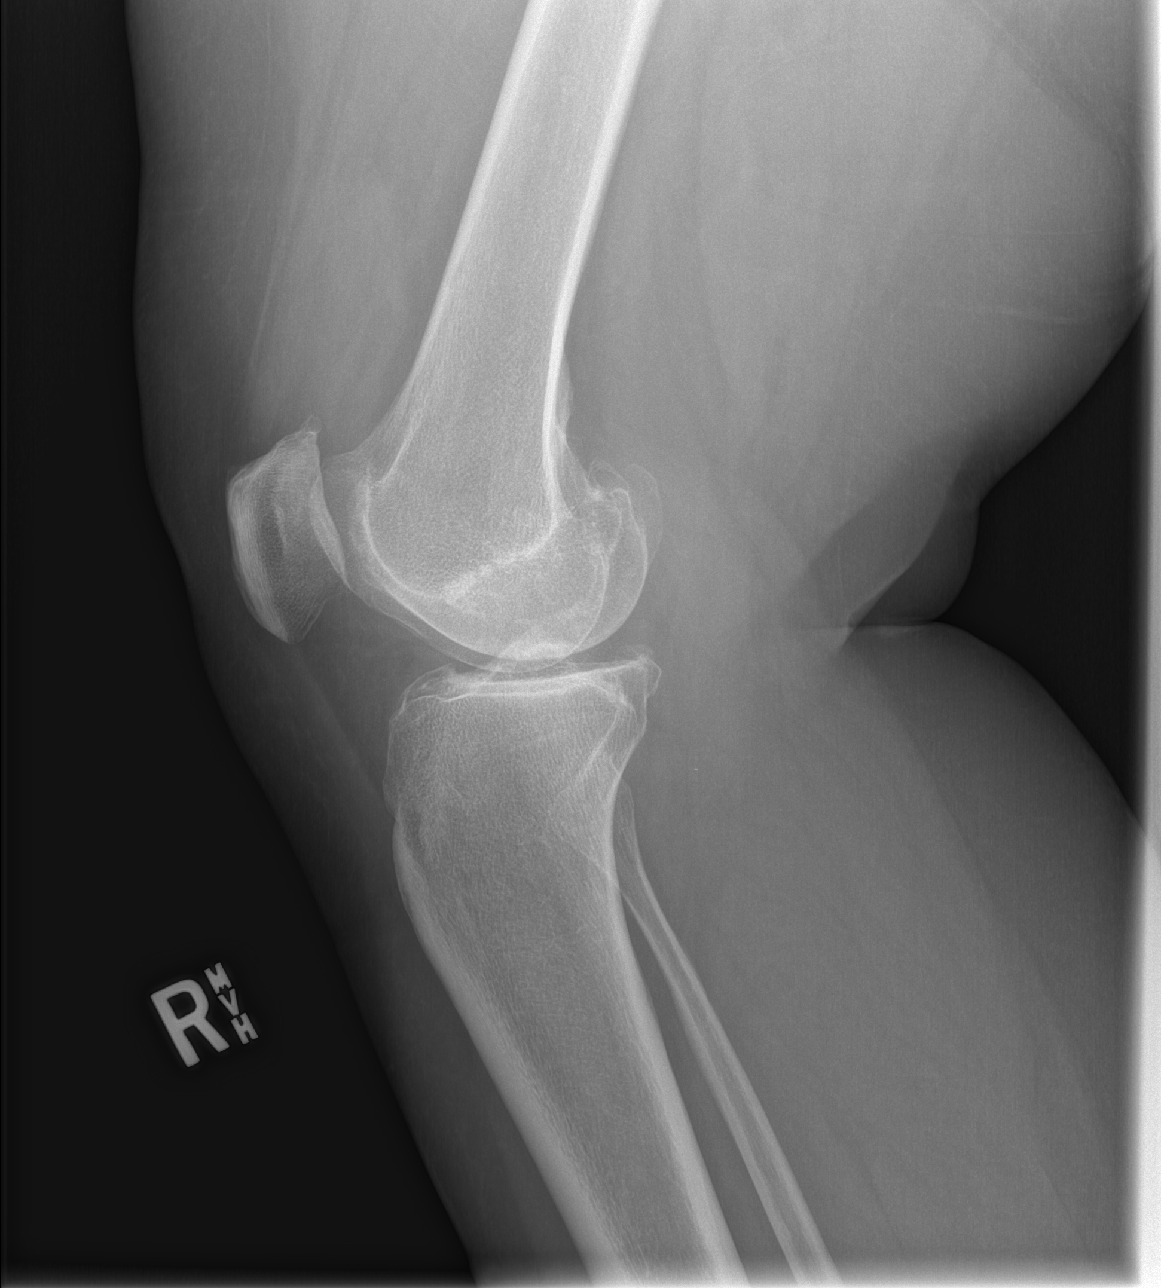

[4 of 4 positions shown; findings below may reference images not displayed]

FINDINGS: Mild tricompartmental degenerative change, with associated mild
joint space narrowing and mild osseous spurring. No evidence of
advanced degenerative change.

No fracture line or displaced fracture fragment seen. No acute or
suspicious osseous lesion. Probable joint effusion within the
suprapatellar bursa. Superficial soft tissues are unremarkable.
IMPRESSION: 1. No acute osseous abnormality.  No fracture or dislocation.
2. Probable joint effusion.
3. Mild tricompartmental degenerative change.
